# Patient Record
Sex: Female | Born: 2000 | Race: White | Hispanic: No | Marital: Single | Smoking: Never smoker
Health system: Southern US, Community
[De-identification: ages and names within clinical notes are randomized; demographics above are authoritative.]

## PROBLEM LIST (undated history)

## (undated) DIAGNOSIS — J45909 Unspecified asthma, uncomplicated: Secondary | ICD-10-CM

## (undated) DIAGNOSIS — J3501 Chronic tonsillitis: Secondary | ICD-10-CM

---
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
## 2021-03-23 ENCOUNTER — Emergency Department (HOSPITAL_COMMUNITY): Payer: BC Managed Care – PPO

## 2021-03-23 ENCOUNTER — Emergency Department (HOSPITAL_COMMUNITY)
Admission: EM | Admit: 2021-03-23 | Discharge: 2021-03-23 | Disposition: A | Discharge status: Home / Self Care | Payer: BC Managed Care – PPO | Attending: Emergency Medicine | Admitting: Emergency Medicine

## 2021-03-23 ENCOUNTER — Encounter (HOSPITAL_COMMUNITY): Payer: Self-pay | Admitting: Emergency Medicine

## 2021-03-23 ENCOUNTER — Other Ambulatory Visit: Payer: Self-pay

## 2021-03-23 DIAGNOSIS — J45909 Unspecified asthma, uncomplicated: Secondary | ICD-10-CM | POA: Insufficient documentation

## 2021-03-23 DIAGNOSIS — M542 Cervicalgia: Secondary | ICD-10-CM | POA: Insufficient documentation

## 2021-03-23 DIAGNOSIS — R0781 Pleurodynia: Secondary | ICD-10-CM

## 2021-03-23 DIAGNOSIS — M79602 Pain in left arm: Secondary | ICD-10-CM | POA: Diagnosis not present

## 2021-03-23 DIAGNOSIS — S50811A Abrasion of right forearm, initial encounter: Secondary | ICD-10-CM | POA: Insufficient documentation

## 2021-03-23 DIAGNOSIS — S59911A Unspecified injury of right forearm, initial encounter: Secondary | ICD-10-CM | POA: Diagnosis present

## 2021-03-23 LAB — POC URINE PREG, ED: Preg Test, Ur: NEGATIVE

## 2021-03-23 MED ORDER — IBUPROFEN 800 MG PO TABS
800.0000 mg | ORAL_TABLET | Freq: Once | ORAL | Status: AC
  Administered 2021-03-23: 800 mg via ORAL
  Filled 2021-03-23: qty 1

## 2021-03-23 MED ORDER — IBUPROFEN 600 MG PO TABS: 600.0000 mg | ORAL_TABLET | Freq: Four times a day (QID) | ORAL | 0 refills | Status: AC | PRN

## 2021-03-23 NOTE — Discharge Instructions (Signed)
Fortunately your xray did not show any broken ribs.  Please take ibuprofen as needed for pain.  Follow instruction below.
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
